# Patient Record
Sex: Male | Born: 1985 | Race: White | Hispanic: No | Smoking: Current every day smoker
Health system: Southern US, Community
[De-identification: ages and names within clinical notes are randomized; demographics above are authoritative.]

## PROBLEM LIST (undated history)

## (undated) DIAGNOSIS — M25569 Pain in unspecified knee: Secondary | ICD-10-CM

## (undated) HISTORY — PX: BELOW KNEE LEG AMPUTATION: SUR23

---

## 2013-07-12 ENCOUNTER — Encounter (HOSPITAL_COMMUNITY): Payer: Self-pay | Admitting: Emergency Medicine

## 2013-07-12 ENCOUNTER — Emergency Department (HOSPITAL_COMMUNITY)
Admission: EM | Admit: 2013-07-12 | Discharge: 2013-07-12 | Disposition: A | Discharge status: Home / Self Care | Payer: Medicaid Other | Attending: Emergency Medicine | Admitting: Emergency Medicine

## 2013-07-12 ENCOUNTER — Emergency Department (HOSPITAL_COMMUNITY): Payer: Medicaid Other

## 2013-07-12 DIAGNOSIS — W010XXA Fall on same level from slipping, tripping and stumbling without subsequent striking against object, initial encounter: Secondary | ICD-10-CM | POA: Insufficient documentation

## 2013-07-12 DIAGNOSIS — Y929 Unspecified place or not applicable: Secondary | ICD-10-CM | POA: Insufficient documentation

## 2013-07-12 DIAGNOSIS — Y939 Activity, unspecified: Secondary | ICD-10-CM | POA: Insufficient documentation

## 2013-07-12 DIAGNOSIS — S83006A Unspecified dislocation of unspecified patella, initial encounter: Secondary | ICD-10-CM | POA: Insufficient documentation

## 2013-07-12 DIAGNOSIS — F17200 Nicotine dependence, unspecified, uncomplicated: Secondary | ICD-10-CM | POA: Insufficient documentation

## 2013-07-12 MED ORDER — OXYCODONE-ACETAMINOPHEN 5-325 MG PO TABS: 2.0000 | ORAL_TABLET | Freq: Once | ORAL | Status: DC

## 2013-07-12 MED ORDER — HYDROMORPHONE HCL PF 1 MG/ML IJ SOLN
1.0000 mg | Freq: Once | INTRAMUSCULAR | Status: AC
  Administered 2013-07-12: 1 mg via INTRAMUSCULAR
  Filled 2013-07-12: qty 1

## 2013-07-12 MED ORDER — OXYCODONE-ACETAMINOPHEN 5-325 MG PO TABS: 2.0000 | ORAL_TABLET | ORAL | Status: AC | PRN

## 2013-07-12 NOTE — ED Notes (Addendum)
Pain rt knee , pt has rt lower leg amputation. Had prosthesis on when fell.  Swelling, , contusion present.  Ice pack applied

## 2013-07-12 NOTE — ED Provider Notes (Signed)
CSN: [id number]     Arrival date & time 07/12/13  1304 History   First MD Initiated Contact with Patient 07/12/13 1326 This chart was scribed for non-physician practitioner Irish EldersKelly Sayid Moll, NP working with Glynn OctaveStephen Rancour, MD by Valera CastleSteven Perry, ED scribe. This patient was seen in room APFT24/APFT24 and the patient's care was started at 1:33 PM.     Chief Complaint  Patient presents with  . Knee Injury    The history is provided by the patient. No language interpreter was used.   HPI Comments: Johnny GowdaStephen Willcutt is a 28 y.o. 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 He denies feeling his knee pop back into place, and reports movement exacerbates his pain. He reports h/o similar right knee dislocation, but states his knee popped back into place last time. He states when he was at orthopedics last, they told him he would eventually need a right knee replacement. He denies any other symptoms. He denies any allergies to medications.  PCP - No primary provider on file.  History reviewed. No pertinent past medical history. Past Surgical History  Procedure Laterality Date  . Below knee leg amputation     No family history on file. History  Substance Use Topics  . Smoking status: Current Every Day Smoker  . Smokeless tobacco: Not on file  . Alcohol Use: No    Review of Systems  Musculoskeletal: Positive for arthralgias (right knee) and joint swelling.  Skin: Negative for wound.  Neurological: Negative for syncope and headaches.  All other systems reviewed and are negative.   Allergies  Review of patient's allergies indicates no known allergies.  Home Medications  No current outpatient prescriptions on file.  BP 139/94  Pulse 94  Temp(Src) 97.9 F (36.6 C) (Oral)  Resp 18  Ht 5\' 8"  (1.727 m)  Wt 240 lb (108.863 kg)  BMI 36.50 kg/m2  SpO2  100%  Physical Exam  Nursing note and vitals reviewed. Constitutional: He is oriented to person, place, and time. He appears well-developed and well-nourished. No distress.  HENT:  Head: Normocephalic and atraumatic.  Eyes: EOM are normal.  Neck: Neck supple.  Cardiovascular: Normal rate.   Pulmonary/Chest: Effort normal. No respiratory distress.  Musculoskeletal: He exhibits edema and tenderness (right knee).  Right BKA.  Neurological: He is alert and oriented to person, place, and time.  Skin: Skin is warm and dry.  Psychiatric: He has a normal mood and affect. His behavior is normal.    ED Course  Procedures (including critical care time)  DIAGNOSTIC STUDIES: Oxygen Saturation is 100% on room air, normal by my interpretation.    COORDINATION OF CARE: 1:37 PM-Discussed treatment plan which includes consulting attending provider, right knee xray, and pain medication with pt at bedside and pt agreed to plan.    No results found for this or any previous visit. Dg Knee Complete 4 Views Right  07/12/2013   CLINICAL DATA:  Knee injury.  EXAM: RIGHT KNEE - COMPLETE 4+ VIEW  COMPARISON:  04/20/2013.  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 The medial and lateral compartment joint spaces are maintained. There is no large joint effusion or evidence of acute fracture.  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 These findings would be best further evaluated with nonemergent MRI.   Electronically Signed   By: Roxy Horseman M.D.   On: 07/12/2013 14:15   Medications  HYDROmorphone (DILAUDID) injection 1 mg (1 mg Intramuscular Given 07/12/13 1349)  HYDROmorphone (DILAUDID) injection 1 mg (1 mg Intramuscular Given 07/12/13 1435)    MDM   1. Patellar dislocation    Pt has been  told by Ortho MD that he has degenerative changes in his right knee and that he would need surgery at some point. Skin warm and dry, good circulation. No signs of skin breakdown or infection in his right stump. 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 His knee was x-rayed and reduced in the ER without difficulty. After reduction it was placed in his orthotic device and a knee immobilizer was applied for stabilization. Pt encouraged to keep knee immobilizer on and when he takes it off, use ace wrap for added support. Ortho referral and follow-up information given.   I personally performed the services described in this documentation, which was scribed in my presence. The recorded information has been reviewed and is accurate.    Irish Elders, NP 07/16/13 [phone]

## 2013-07-12 NOTE — Discharge Instructions (Signed)
Knee Dislocation Knee dislocation is the displacement of the bones that make up the knee. These bones are the thigh bone (femur), the lower leg bones (tibia and fibula), and the kneecap (patella). Strong, fibrous tissues that connect bones to each other (ligaments) support the knee and keep the bones together. Typically, at least 2 of the 4 major ligaments of the knee are torn before a dislocation of the knee can occur.  CAUSES  Knee dislocation is the result of a force that causes an excessive extension of the knee joint (hyperextension) that is greater than the ligaments can withstand. This is often caused by a direct hit (trauma). In rare cases, it is caused by a noncontact injury, such as stepping in a hole in the ground and twisting your knee. Typically, it is associated with vehicular trauma or contact sports. RISK FACTORS Knee dislocations are not common. However, some people are at greater risk of these injuries, including:  People who participate in sports that involve pivoting, jumping, cutting, or changing direction (basketball, gymnastics, soccer, volleyball).  People who participate in contact sports (football, rugby, lacrosse).  People with poor leg strength and flexibility.  People born with greater looseness in their joints. SYMPTOMS  One or more "pops" heard or felt at the time of injury.  Knee swelling within 1 to 2 hours after the injury.  Deformity of your knee.  Loss of motion in your knee.  A sensation that your knee is "giving way" or "buckling."  Numbness, weakness, discoloration, or coldness of your foot and ankle. This may occur if you also have nerve or blood vessel injury. DIAGNOSIS Knee dislocation is diagnosed using results of a physical exam. Usually, an X-ray exam and an MRI scan (magnetic resonance imaging) are done to see any cartilage or ligament injuries. TREATMENT  Knee dislocations require emergency realignment of the bones (reduction). Once your  knee is realigned, it is held in position by a splint or pins drilled into the bones of your upper and lower legs and connected to metal rods outside the leg to hold your knee in position (external fixator). Often, an exam such as an ultrasound exam or angiography will be done to be sure that a major blood vessel has not been damaged. Most often, surgery to repair damaged blood vessels is done when your torn ligaments are repaired. HOME CARE INSTRUCTIONS The following measures can help to reduce pain and hasten the healing process:  Rest your injured joint. Do not move it. Avoid activities similar to the one that caused your injury.  Apply ice to your injured joint for 1 to 2 days after your reduction or as directed by your caregiver. Applying ice helps to reduce inflammation and pain.  Put ice in a plastic bag.  Place a towel between your skin and the bag.  Leave the ice on for 15 to 20 minutes at a time, every couple of hours while you are awake.  Elevate your leg above your heart as instructed by your caregiver.  Move your ankle and toes as instructed by your caregiver.  Take over-the-counter or prescription medicine for pain as directed by your caregiver. SEEK IMMEDIATE MEDICAL CARE IF:  Your splint or external fixator becomes damaged.  Your pain becomes worse rather than better.  You lose feeling in your foot, or you cannot move your ankle and toes. MAKE SURE YOU:  Understand these instructions.  Will watch your condition.  Will get help right away if you are not doing  well or get worse. Document Released: 02/19/2001 Document Revised: 08/19/2011 Document Reviewed: 11/25/2010 [facility] Patient Information 2014 [place], [place].   Follow-up with Orthopedics ASAP Wear knee immobilizer and use crutches Percocet for pain

## 2013-07-12 NOTE — ED Notes (Signed)
Pt states he slipped on a wet floor and injured his right knee

## 2013-07-14 ENCOUNTER — Encounter (HOSPITAL_COMMUNITY): Payer: Self-pay | Admitting: Emergency Medicine

## 2013-07-14 ENCOUNTER — Emergency Department (HOSPITAL_COMMUNITY)
Admission: EM | Admit: 2013-07-14 | Discharge: 2013-07-14 | Disposition: A | Discharge status: Home / Self Care | Payer: Medicaid Other | Attending: Emergency Medicine | Admitting: Emergency Medicine

## 2013-07-14 DIAGNOSIS — M25569 Pain in unspecified knee: Secondary | ICD-10-CM | POA: Insufficient documentation

## 2013-07-14 DIAGNOSIS — Z79899 Other long term (current) drug therapy: Secondary | ICD-10-CM | POA: Insufficient documentation

## 2013-07-14 DIAGNOSIS — F17200 Nicotine dependence, unspecified, uncomplicated: Secondary | ICD-10-CM | POA: Insufficient documentation

## 2013-07-14 DIAGNOSIS — R52 Pain, unspecified: Secondary | ICD-10-CM | POA: Insufficient documentation

## 2013-07-14 DIAGNOSIS — G8929 Other chronic pain: Secondary | ICD-10-CM | POA: Insufficient documentation

## 2013-07-14 DIAGNOSIS — M25561 Pain in right knee: Secondary | ICD-10-CM

## 2013-07-14 HISTORY — DX: Pain in unspecified knee: M25.569

## 2013-07-14 MED ORDER — KETOROLAC TROMETHAMINE 60 MG/2ML IM SOLN
60.0000 mg | Freq: Once | INTRAMUSCULAR | Status: AC
  Administered 2013-07-14: 60 mg via INTRAMUSCULAR
  Filled 2013-07-14: qty 2

## 2013-07-14 MED ORDER — PROMETHAZINE HCL 12.5 MG PO TABS
25.0000 mg | ORAL_TABLET | Freq: Once | ORAL | Status: AC
  Administered 2013-07-14: 25 mg via ORAL
  Filled 2013-07-14: qty 2

## 2013-07-14 MED ORDER — DICLOFENAC SODIUM 75 MG PO TBEC: 75.0000 mg | DELAYED_RELEASE_TABLET | Freq: Two times a day (BID) | ORAL | Status: AC

## 2013-07-14 MED ORDER — OXYCODONE-ACETAMINOPHEN 5-325 MG PO TABS
1.0000 | ORAL_TABLET | Freq: Once | ORAL | Status: AC
  Administered 2013-07-14: 1 via ORAL
  Filled 2013-07-14: qty 1

## 2013-07-14 MED ORDER — OXYCODONE HCL 5 MG PO TABS: ORAL_TABLET | ORAL | Status: AC

## 2013-07-14 MED ORDER — DEXAMETHASONE 6 MG PO TABS: ORAL_TABLET | ORAL | Status: AC

## 2013-07-14 MED ORDER — DEXAMETHASONE SODIUM PHOSPHATE 4 MG/ML IJ SOLN
8.0000 mg | Freq: Once | INTRAMUSCULAR | Status: AC
  Administered 2013-07-14: 8 mg via INTRAMUSCULAR
  Filled 2013-07-14: qty 2

## 2013-07-14 NOTE — Discharge Instructions (Signed)
Please call [facility], explain your pain dilemma, and see if your appointment can be moved up. Please use Decadron and diclofenac daily with food. Use oxycodone for more severe pain. It is important that you take the medication as ordered, do not exceed the recommended dose of this particular medication. It may be beneficial to have your physician in [place] to contact a physician [place] in [place] to assist with transfer of your care.

## 2013-07-14 NOTE — ED Provider Notes (Signed)
CSN: [id number]     Arrival date & time 07/14/13  1844 History   First MD Initiated Contact with Patient 07/14/13 2056     Chief Complaint  Patient presents with  . 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 He wears a prosthesis. 2 days ago he fell in the kitchen and injured his knee. Since that time he's been having more and more problems keeping his knee from coming out of place. He has had problems with the knee since birth. But over the last 2 days the problem is gotten progressively worse. 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 The patient was placed on Percocet. He is taking 2 tablets every 4 hours, and states that it is not touching his pain. The patient is scheduled to be seen by the he'll for orthopedic physicians next week, but states he cannot take the pain until that time. The patient is recently moving to this area from [place] and he does not have a primary physician to assist with his pain management.  Patient is a 28 y.o. male presenting with knee pain. The history is provided by the patient and the spouse.  Knee Pain Location:  Knee Knee location:  R knee Associated symptoms: no back pain and no neck pain     Past Medical History  Diagnosis Date  . Knee pain    Past Surgical History  Procedure Laterality Date  . Below knee leg amputation     No family history on file. History  Substance Use Topics  . Smoking status: Current Every Day Smoker  . Smokeless tobacco: Not on file  . Alcohol Use: No    Review of Systems  Constitutional: Negative for activity change.       All ROS Neg except as noted in HPI  HENT: Negative for nosebleeds.   Eyes: Negative for photophobia and discharge.   Respiratory: Negative for cough, shortness of breath and wheezing.   Cardiovascular: Negative for chest pain and palpitations.  Gastrointestinal: Negative for abdominal pain and blood in stool.  Genitourinary: Negative for dysuria, frequency and hematuria.  Musculoskeletal: Positive for arthralgias. Negative for back pain and neck pain.  Skin: Negative.   Neurological: Negative for dizziness, seizures and speech difficulty.  Psychiatric/Behavioral: Negative for hallucinations and confusion.    Allergies  Review of patient's allergies indicates no known allergies.  Home Medications   Current Outpatient Rx  Name  Route  Sig  Dispense  Refill  . oxyCODONE-acetaminophen (PERCOCET/ROXICET) 5-325 MG per tablet   Oral   Take 2 tablets by mouth every 4 (four) hours as needed for severe pain.   25 tablet   0    BP 111/59  Pulse 118  Temp(Src) 98.1 F (36.7 C) (Oral)  Resp 20  Ht 5\' 9"  (1.753 m)  Wt 245 lb (111.131 kg)  BMI 36.16 kg/m2  SpO2 96% Physical Exam  Nursing note and vitals reviewed. Constitutional: He is oriented to person, place, and time. He appears well-developed and well-nourished.  Non-toxic appearance.  HENT:  Head: Normocephalic.  Right Ear: Tympanic membrane and external ear normal.  Left Ear: Tympanic membrane and external ear normal.  Eyes: EOM and lids are normal. Pupils are equal, round, and reactive to light.  Neck: Normal range of motion.  Neck supple. Carotid bruit is not present.  Cardiovascular: Normal rate, regular rhythm, normal heart sounds, intact distal pulses and normal pulses.   Pulmonary/Chest: Breath sounds normal. No respiratory distress.  Abdominal: Soft. Bowel sounds are normal. There is no tenderness. There is no guarding.  Musculoskeletal: Normal range of motion.  There is good range of motion of the right hip. There is partial dislocation of the patella on the right. There is no joint effusion appreciated. The joint is not hot. The stump  is pain to, and shows no ulcer.  Lymphadenopathy:       Head (right side): No submandibular adenopathy present.       Head (left side): No submandibular adenopathy present.    He has no cervical adenopathy.  Neurological: He is alert and oriented to person, place, and time. He has normal strength. No cranial nerve deficit or sensory deficit.  Skin: Skin is warm and dry.  Psychiatric: He has a normal mood and affect. His speech is normal.    ED Course  Procedures (including critical care time) Labs Review Labs Reviewed - No data to display Imaging Review No results found.  EKG Interpretation   None       MDM  No diagnosis found. *I have reviewed nursing notes, vital signs, and all appropriate lab and imaging results for this patient.*  Patient has chronic dislocation of the patella. This is been aggravated following a fall 2 days ago. The patient states that the pain medication (Percocet) is not helping. The patient does not have a primary care physician to assist him with his pain management. The plan at this time is to add Decadron and diclofenac to the Percocet. Patient also advised to discuss this with his surgical team at [facility] orth[facility], to see if his appointment can be moved up.  Kathie Dike, PA-C 07/14/13 2147

## 2013-07-14 NOTE — ED Provider Notes (Signed)
Medical screening examination/treatment/procedure(s) were performed by non-physician practitioner and as supervising physician I was immediately available for consultation/collaboration.  EKG Interpretation   None         Benny LennertJoseph L Maybree Riling, MD 07/14/13 2226

## 2013-07-14 NOTE — ED Notes (Signed)
Pt reports has r bka and wears a prosthetic leg.  Reports fell on kitchen floor 2 days ago.  Reports knee was dislocated.    Pt says his knee keeps popping in and out.  Pt says the pain medication that he was given is not helping.  Has an appt with [facility] next week.

## 2013-07-14 NOTE — ED Provider Notes (Signed)
CSN: [id number]     Arrival date & time 07/14/13  1844 History   First MD Initiated Contact with Patient 07/14/13 2056     Chief Complaint  Patient presents with  . Knee Pain   (Consider location/radiation/quality/duration/timing/severity/associated sxs/prior Treatment) Patient is a 28 y.o. male presenting with knee pain. The history is provided by the patient.  Knee Pain Location:  Knee Knee location:  R knee Pain details:    Quality:  Aching and sharp   Severity:  Severe   Onset quality:  Gradual   Duration:  2 days   Timing:  Intermittent   Subjective pain progression: Acute on chronic knee pain. Dislocation: yes (intermittent dislocation)   Foreign body present:  No foreign bodies Prior injury to area: Birth defect of the right lower extremity. Relieved by:  Nothing Worsened by:  Activity Ineffective treatments:  None tried Associated symptoms: back pain   Associated symptoms: no neck pain   Risk factors: no frequent fractures     Past Medical History  Diagnosis Date  . Knee pain    Past Surgical History  Procedure Laterality Date  . Below knee leg amputation     No family history on file. History  Substance Use Topics  . Smoking status: Current Every Day Smoker  . Smokeless tobacco: Not on file  . Alcohol Use: No    Review of Systems  Constitutional: Negative for activity change.       All ROS Neg except as noted in HPI  HENT: Negative for nosebleeds.   Eyes: Negative for photophobia and discharge.  Respiratory: Negative for cough, shortness of breath and wheezing.   Cardiovascular: Negative for chest pain and palpitations.  Gastrointestinal: Negative for abdominal pain and blood in stool.  Genitourinary: Negative for dysuria, frequency and hematuria.  Musculoskeletal: Positive for arthralgias and back pain. Negative for neck pain.  Skin: Negative.   Neurological: Negative for dizziness, seizures and speech difficulty.  Psychiatric/Behavioral: Negative  for hallucinations and confusion.    Allergies  Review of patient's allergies indicates no known allergies.  Home Medications   Current Outpatient Rx  Name  Route  Sig  Dispense  Refill  . dexamethasone (DECADRON) 6 MG tablet      1 po bid with food   12 tablet   0   . diclofenac (VOLTAREN) 75 MG EC tablet   Oral   Take 1 tablet (75 mg total) by mouth 2 (two) times daily.   14 tablet   0   . oxyCODONE (ROXICODONE) 5 MG immediate release tablet      1 or 2 po q6h prn pain   25 tablet   0   . oxyCODONE-acetaminophen (PERCOCET/ROXICET) 5-325 MG per tablet   Oral   Take 2 tablets by mouth every 4 (four) hours as needed for severe pain.   25 tablet   0    BP 111/59  Pulse 118  Temp(Src) 98.1 F (36.7 C) (Oral)  Resp 20  Ht 5\' 9"  (1.753 m)  Wt 245 lb (111.131 kg)  BMI 36.16 kg/m2  SpO2 96% Physical Exam  Nursing note and vitals reviewed. Constitutional: He is oriented to person, place, and time. He appears well-developed and well-nourished.  Non-toxic appearance.  HENT:  Head: Normocephalic.  Right Ear: Tympanic membrane and external ear normal.  Left Ear: Tympanic membrane and external ear normal.  Eyes: EOM and lids are normal. Pupils are equal, round, and reactive to light.  Neck: Normal range of motion.  Neck supple. Carotid bruit is not present.  Cardiovascular: Normal rate, regular rhythm, normal heart sounds, intact distal pulses and normal pulses.   Pulmonary/Chest: Breath sounds normal. No respiratory distress.  Abdominal: Soft. Bowel sounds are normal. There is no tenderness. There is no guarding.  Musculoskeletal: Normal range of motion.  Good ROM of the right hip. Pain with attempted flex/ext of the right knee. Deformity of the distal right lower extremity as a result of birth defect. No red or hot areas.No posterior mass.  Lymphadenopathy:       Head (right side): No submandibular adenopathy present.       Head (left side): No submandibular adenopathy  present.    He has no cervical adenopathy.  Neurological: He is alert and oriented to person, place, and time. He has normal strength. No cranial nerve deficit or sensory deficit.  Skin: Skin is warm and dry.  Psychiatric: He has a normal mood and affect. His speech is normal.    ED Course  Procedures (including critical care time) Labs Review Labs Reviewed - No data to display Imaging Review No results found.  EKG Interpretation   None       MDM  No diagnosis found. *I have reviewed nursing notes, vital signs, and all appropriate lab and imaging results for this patient.**  Pt states he has an appointment with [facility] to correct the chronic dislocation of the right knee. The appointment is still a few weeks off. Pt c/o pain after accident in the kitchen injuring the already painful right knee.  Pt was seen in ED after the initial injury. He does not have a PCP an presents for assistance with pain meds while waiting for orthopedic procedure. Rx for percocet and decadron given to the patient.Kathie Dike.  Autry Prust M Keon Benscoter, PA-C 07/16/13 1714

## 2013-07-16 NOTE — ED Provider Notes (Signed)
Medical screening examination/treatment/procedure(s) were performed by non-physician practitioner and as supervising physician I was immediately available for consultation/collaboration.  EKG Interpretation   None        Glynn OctaveStephen Fern Canova, MD 07/16/13 [phone]

## 2013-07-19 NOTE — ED Provider Notes (Signed)
Medical screening examination/treatment/procedure(s) were performed by non-physician practitioner and as supervising physician I was immediately available for consultation/collaboration.  EKG Interpretation   None         See Beharry L Myalynn Lingle, MD 07/19/13 1534 

## 2014-12-24 IMAGING — CR DG KNEE COMPLETE 4+V*R*
4 series · 4 of 4 positions shown · non-contrast
Comparison: 04/20/2013.

CLINICAL DATA: Knee injury.

EXAM:
RIGHT KNEE - COMPLETE 4+ VIEW

[view not recorded (1 of 4)]
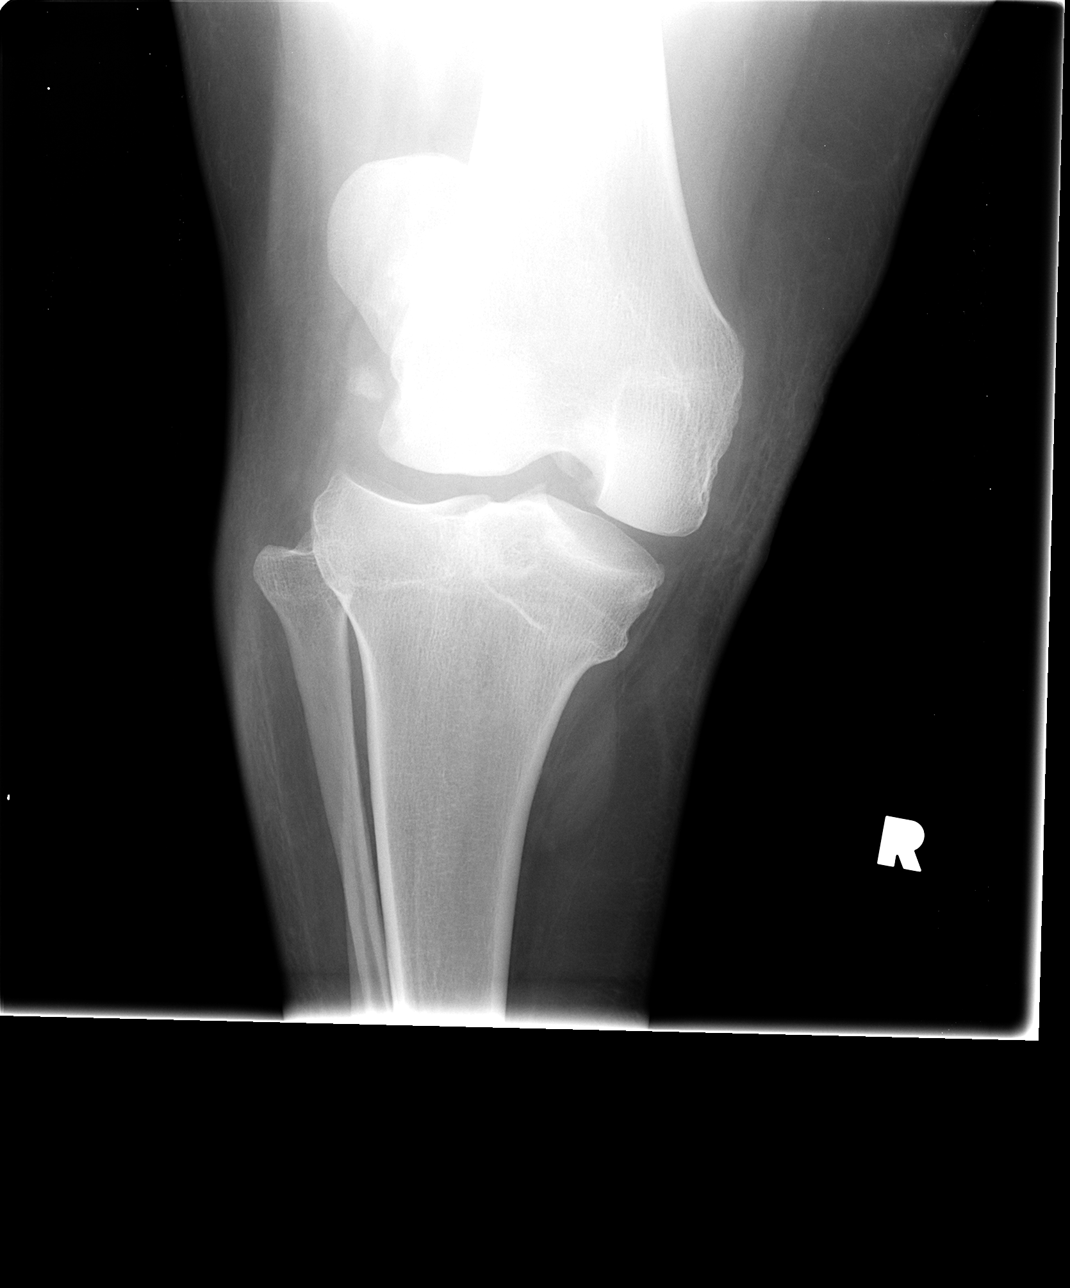

[view not recorded (2 of 4)]
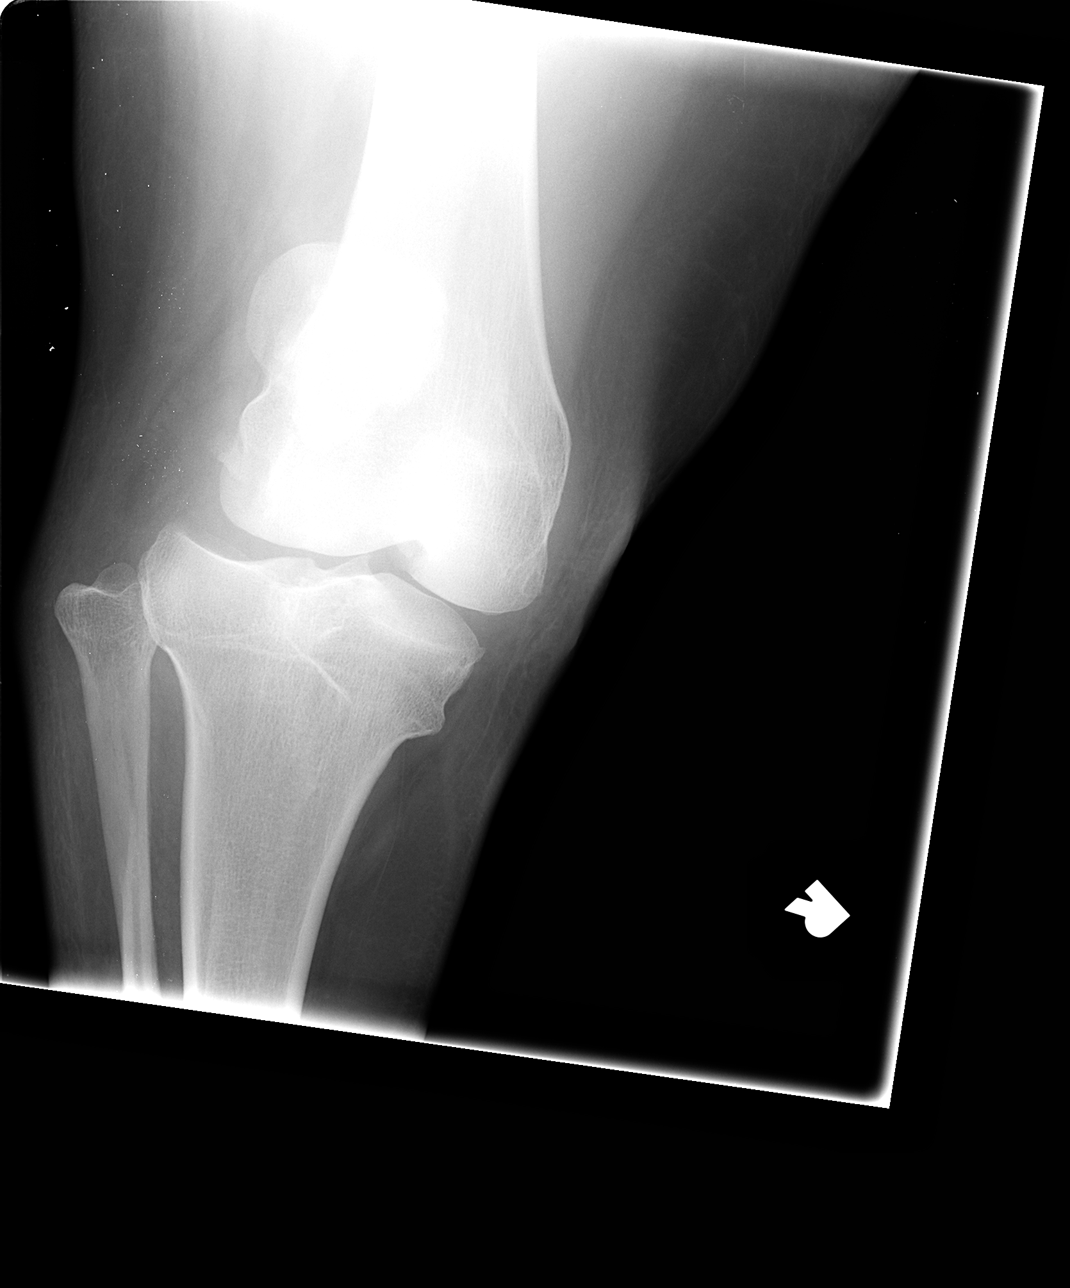

[view not recorded (3 of 4)]
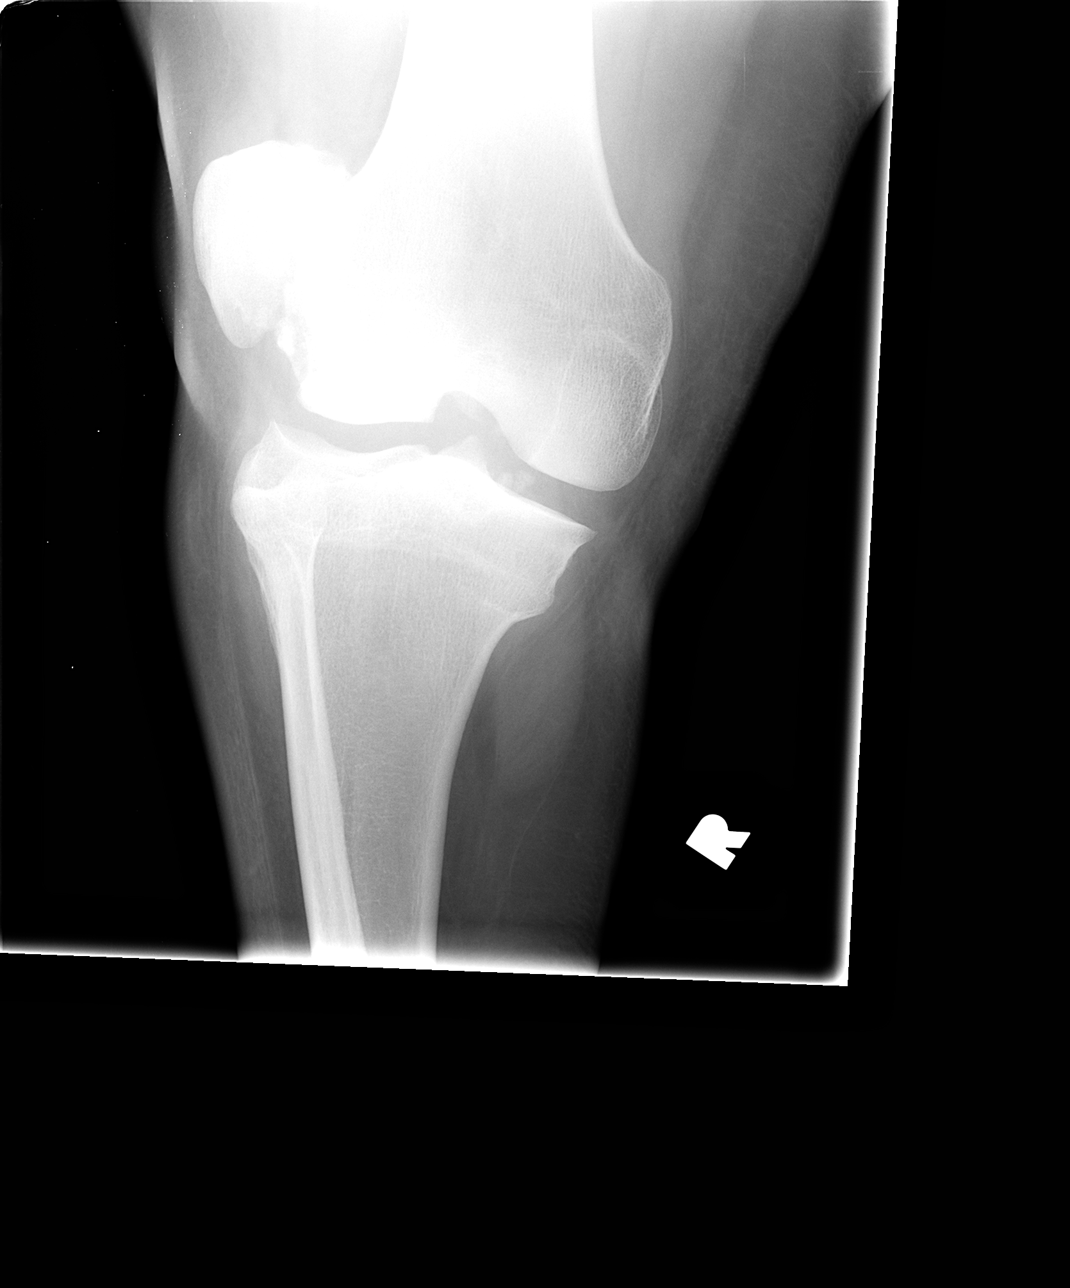

[view not recorded (4 of 4)]
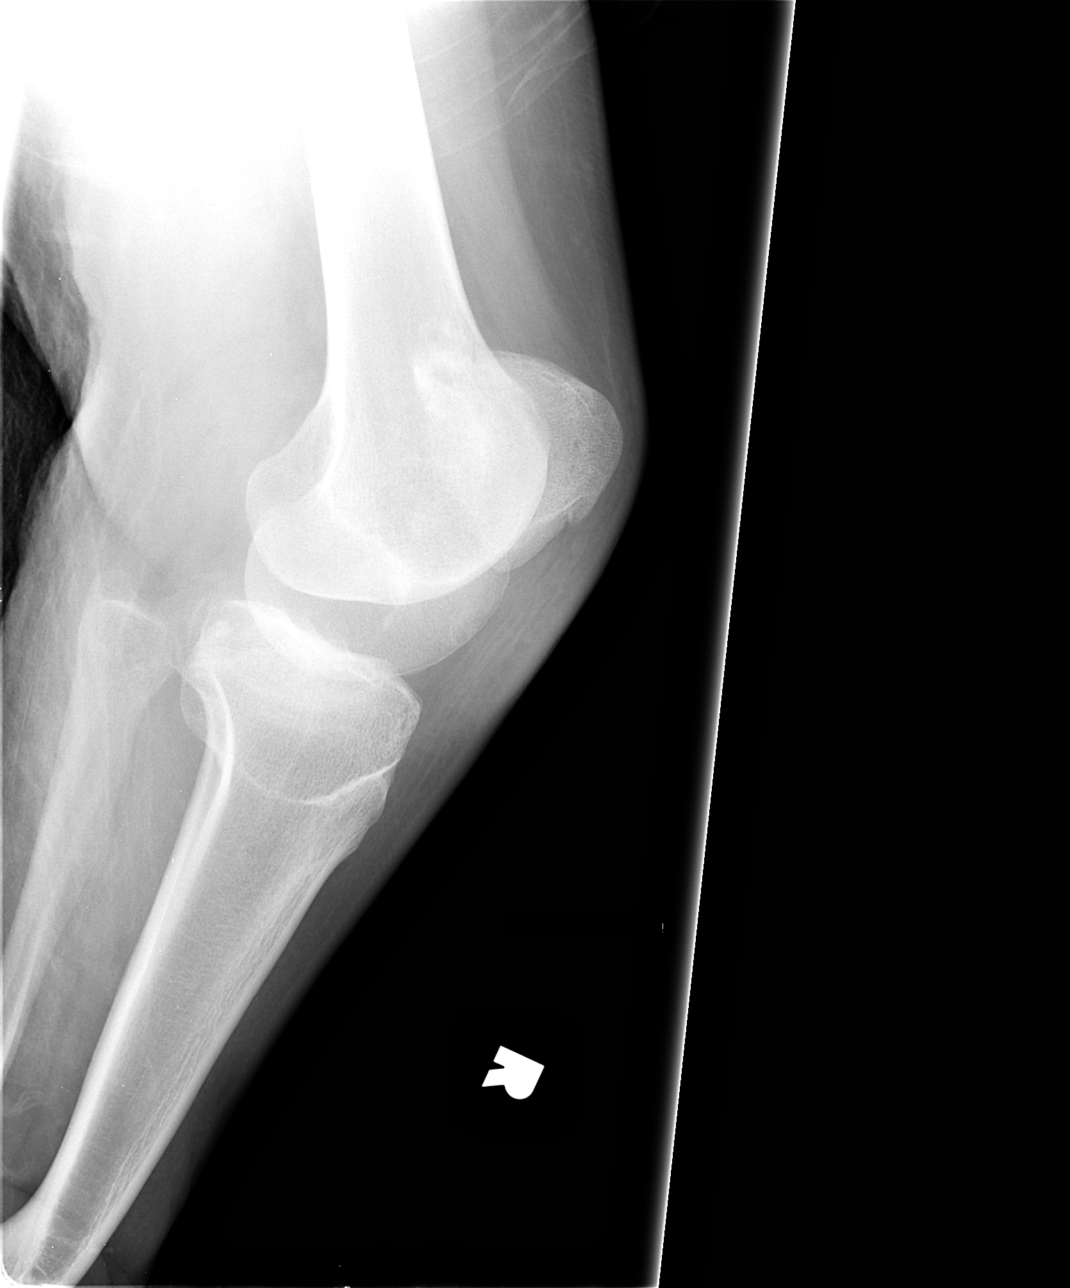

[4 of 4 positions shown; findings below may reference images not displayed]

FINDINGS: There is suspicion of chronic lateral patellar dislocation injury
with irregular patellofemoral joint space loss, lateral subluxation
of the patella and multiple intra-articular loose bodies. The medial
and lateral compartment joint spaces are maintained. There is no
large joint effusion or evidence of acute fracture.
IMPRESSION: Suspicion of chronic lateral patellar dislocation injury with
intra-articular loose bodies. These findings would be best further
evaluated with nonemergent MRI.
